# Patient Record
Sex: Female | Born: 1970 | Race: White | Hispanic: No | Marital: Married | Smoking: Never smoker
Health system: Southern US, Community
[De-identification: ages and names within clinical notes are randomized; demographics above are authoritative.]

## PROBLEM LIST (undated history)

## (undated) DIAGNOSIS — F32A Depression, unspecified: Secondary | ICD-10-CM

## (undated) DIAGNOSIS — J45909 Unspecified asthma, uncomplicated: Secondary | ICD-10-CM

## (undated) HISTORY — PX: KNEE SURGERY: SHX244

---

## 1998-03-17 ENCOUNTER — Other Ambulatory Visit: Admission: RE | Admit: 1998-03-17 | Discharge: 1998-03-17 | Payer: Self-pay | Admitting: Obstetrics and Gynecology

## 1999-04-15 ENCOUNTER — Other Ambulatory Visit: Admission: RE | Admit: 1999-04-15 | Discharge: 1999-04-15 | Payer: Self-pay | Admitting: Obstetrics & Gynecology

## 1999-05-31 ENCOUNTER — Encounter: Admission: RE | Admit: 1999-05-31 | Discharge: 1999-05-31 | Payer: Self-pay | Admitting: Family Medicine

## 1999-05-31 ENCOUNTER — Encounter: Payer: Self-pay | Admitting: Family Medicine

## 1999-10-04 ENCOUNTER — Encounter: Payer: Self-pay | Admitting: Otolaryngology

## 1999-10-04 ENCOUNTER — Encounter: Admission: RE | Admit: 1999-10-04 | Discharge: 1999-10-04 | Payer: Self-pay | Admitting: Otolaryngology

## 2000-04-27 ENCOUNTER — Other Ambulatory Visit: Admission: RE | Admit: 2000-04-27 | Discharge: 2000-04-27 | Payer: Self-pay | Admitting: Obstetrics & Gynecology

## 2000-05-23 ENCOUNTER — Other Ambulatory Visit: Admission: RE | Admit: 2000-05-23 | Discharge: 2000-05-23 | Payer: Self-pay | Admitting: Obstetrics & Gynecology

## 2000-12-21 ENCOUNTER — Inpatient Hospital Stay (HOSPITAL_COMMUNITY): Admission: AD | Admit: 2000-12-21 | Discharge: 2000-12-22 | Payer: Self-pay | Admitting: Obstetrics & Gynecology

## 2000-12-21 ENCOUNTER — Encounter (INDEPENDENT_AMBULATORY_CARE_PROVIDER_SITE_OTHER): Payer: Self-pay

## 2001-01-25 ENCOUNTER — Other Ambulatory Visit: Admission: RE | Admit: 2001-01-25 | Discharge: 2001-01-25 | Payer: Self-pay | Admitting: Obstetrics & Gynecology

## 2001-07-17 ENCOUNTER — Encounter: Payer: Self-pay | Admitting: Obstetrics & Gynecology

## 2001-07-17 ENCOUNTER — Ambulatory Visit (HOSPITAL_COMMUNITY): Admission: RE | Admit: 2001-07-17 | Discharge: 2001-07-17 | Payer: Self-pay | Admitting: Obstetrics & Gynecology

## 2002-01-11 ENCOUNTER — Ambulatory Visit (HOSPITAL_COMMUNITY): Admission: RE | Admit: 2002-01-11 | Discharge: 2002-01-11 | Payer: Self-pay | Admitting: Obstetrics & Gynecology

## 2002-01-11 ENCOUNTER — Encounter: Payer: Self-pay | Admitting: Obstetrics & Gynecology

## 2002-01-19 ENCOUNTER — Inpatient Hospital Stay (HOSPITAL_COMMUNITY): Admission: AD | Admit: 2002-01-19 | Discharge: 2002-01-19 | Payer: Self-pay | Admitting: Obstetrics

## 2002-03-12 ENCOUNTER — Encounter: Payer: Self-pay | Admitting: Obstetrics & Gynecology

## 2002-03-12 ENCOUNTER — Ambulatory Visit (HOSPITAL_COMMUNITY): Admission: RE | Admit: 2002-03-12 | Discharge: 2002-03-12 | Payer: Self-pay | Admitting: Obstetrics & Gynecology

## 2002-04-26 ENCOUNTER — Inpatient Hospital Stay (HOSPITAL_COMMUNITY): Admission: AD | Admit: 2002-04-26 | Discharge: 2002-04-26 | Payer: Self-pay | Admitting: Obstetrics & Gynecology

## 2002-05-31 ENCOUNTER — Inpatient Hospital Stay (HOSPITAL_COMMUNITY): Admission: AD | Admit: 2002-05-31 | Discharge: 2002-06-02 | Payer: Self-pay | Admitting: Obstetrics & Gynecology

## 2004-09-20 ENCOUNTER — Ambulatory Visit (HOSPITAL_COMMUNITY): Admission: RE | Admit: 2004-09-20 | Discharge: 2004-09-20 | Payer: Self-pay | Admitting: Obstetrics & Gynecology

## 2006-07-05 ENCOUNTER — Emergency Department: Payer: Self-pay | Admitting: Emergency Medicine

## 2009-01-16 ENCOUNTER — Other Ambulatory Visit: Admission: RE | Admit: 2009-01-16 | Discharge: 2009-01-16 | Payer: Self-pay | Admitting: Family Medicine

## 2009-07-14 ENCOUNTER — Encounter: Payer: Self-pay | Admitting: Gastroenterology

## 2009-07-21 ENCOUNTER — Encounter: Payer: Self-pay | Admitting: Gastroenterology

## 2009-07-27 ENCOUNTER — Ambulatory Visit: Payer: Self-pay | Admitting: Gastroenterology

## 2009-07-27 DIAGNOSIS — R197 Diarrhea, unspecified: Secondary | ICD-10-CM | POA: Insufficient documentation

## 2009-08-12 ENCOUNTER — Telehealth: Payer: Self-pay | Admitting: Gastroenterology

## 2009-08-13 ENCOUNTER — Telehealth: Payer: Self-pay | Admitting: Gastroenterology

## 2010-06-03 NOTE — Letter (Signed)
Summary: Jasmine Moody   Imported By: Sherian Rein 07/30/2009 14:53:27  _____________________________________________________________________  External Attachment:    Type:   Image     Comment:   External Document

## 2010-06-03 NOTE — Assessment & Plan Note (Signed)
Summary: PERSISTANT DIARRHEA...AS.   History of Present Illness Visit Type: consult  Primary GI MD: Elie Goody MD [facility] Primary Provider: Mila Palmer, MD  Requesting Provider: Hall Busing, MD  Chief Complaint: Change in bowel habits, diarrhea, and weight gain  History of Present Illness:   This is a 40 year old female, who has had intermittent diarrhea for 2 months. She notes the onset after a diarrheal illness. That lasted for several days and since that time she has had alternating normal stools and loose stools. Standard blood work, plus a TSH and a celiac panel were negative. She reports that stool cultures and stool Hemoccults were obtained that were also negative. Over the past 4-5 days, her diarrhea has abated in her bowel habits have returned to normal.   GI Review of Systems    Reports weight gain.      Denies abdominal pain, acid reflux, belching, bloating, chest pain, dysphagia with liquids, dysphagia with solids, heartburn, loss of appetite, nausea, vomiting, vomiting blood, and  weight loss.      Reports change in bowel habits and  diarrhea.     Denies anal fissure, black tarry stools, constipation, diverticulosis, fecal incontinence, heme positive stool, hemorrhoids, irritable bowel syndrome, jaundice, light color stool, liver problems, rectal bleeding, and  rectal pain.   Current Medications (verified): 1)  Mirena 20 Mcg/24hr Iud (Levonorgestrel) .... As Directed 2)  Fluoxetine Hcl 20 Mg Tabs (Fluoxetine Hcl) .... One Tablet By Mouth Once Daily  Allergies (verified): 1)  ! 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  Alcohol Use - no Daily Caffeine Use: one daily   Illicit Drug Use - no Patient gets regular exercise.  Review of Systems       The patient complains of night sweats.         The pertinent positives and negatives are noted as above and in the HPI. All other ROS were reviewed and were negative.   Vital Signs:  Patient profile:   40 year old female Height:      64 inches Weight:      134 pounds BMI:     23.08 BSA:     1.65 Pulse rate:   76 / minute Pulse rhythm:   regular BP sitting:   100 / 60  (left arm) Cuff size:   regular  Vitals Entered By: Ok Anis CMA (July 27, 2009 2:30 PM)  Physical Exam  General:  Well developed, well nourished, no acute distress. Head:  Normocephalic and atraumatic. Eyes:  PERRLA, no icterus. Ears:  Normal auditory acuity. Mouth:  No deformity or lesions, dentition normal. Neck:  Supple; no masses or thyromegaly. Lungs:  Clear throughout to auscultation. Heart:  Regular rate and rhythm; no murmurs, rubs,  or bruits. Abdomen:  Soft, nontender and nondistended. No masses, hepatosplenomegaly or hernias noted. Normal bowel sounds. Msk:  Symmetrical with no gross deformities. Normal posture. Pulses:  Normal pulses noted. Extremities:  No clubbing, cyanosis, edema or deformities noted. Neurologic:  Alert and  oriented x4;  grossly normal neurologically. Cervical Nodes:  No significant cervical adenopathy. Inguinal Nodes:  No significant inguinal adenopathy. Psych:  Alert and cooperative. Normal mood and affect.  Impression & Recommendations:  Problem # 1:  DIARRHEA (ICD-787.91) Diarrhea,  which has now abated. I suspect she had a postinfectious diarrhea. If her diarrhea returns, consider colonoscopy to further evaluate for inflammatory bowel disease, and other types of colitis. She is agreeable with this plan and will call if she has further symptoms.  Patient Instructions: 1)  Please continue current medications.  2)  Please schedule a follow-up appointment as needed.  3)  Copy sent to :  Carolin Coy, MD 4)  The medication list was reviewed and reconciled.  All changed / newly prescribed medications were explained.  A complete medication list was provided to the patient / caregiver.

## 2010-06-03 NOTE — Progress Notes (Signed)
Summary: Triage  Phone Note Call from Patient Call back at Home Phone [phone]   Caller: Husband   John Call For: Dr. Russella Dar Reason for Call: Talk to Nurse Summary of Call: Pt continues to be nauseated with vomiting and diarrhea. Initial call taken by: Karna Christmas,  August 12, 2009 4:11 PM  Follow-up for Phone Call        Left message for patient to call back Darcey Nora RN, [facility]  August 13, 2009 9:17 AM  see phone note from 08/14/09 Darcey Nora RN, [facility]  August 14, 2009 9:26 AM

## 2010-06-03 NOTE — Progress Notes (Signed)
Summary: diarrhea  Phone Note Call from Patient Call back at Home Phone [phone] Call back at [phone]   Caller: Patient Call For: Dr. Russella Dar Reason for Call: Talk to Nurse Summary of Call: pt had stomach virus last week and diarrhea "started up again this week"... pt wants to know if she should have a COL Initial call taken by: Vallarie Mare,  August 13, 2009 4:28 PM  Follow-up for Phone Call        Left message for patient to call back Darcey Nora RN, [facility]  August 14, 2009 8:26 AM  Patient with rectal mucus discharge after meals.  She is having incontinence of stool and mucus.  She reports that she had vomiting last week with a stomach virus.  per last office note you were going to consider colon if symptoms returned.  Please advise if ok to proceed with direct colon.    Follow-up by: Darcey Nora RN, CGRN,  August 14, 2009 2:51 PM  Additional Follow-up for Phone Call Additional follow up Details #1::        OK for direct colon. Additional Follow-up by: Meryl Dare MD Clementeen Graham,  August 17, 2009 10:50 AM    Additional Follow-up for Phone Call Additional follow up Details #2::    message left for pt. to call back   Teryl Lucy RN  August 18, 2009 9:26 AM  Left message for patient to call back Darcey Nora RN, [facility]  August 19, 2009 9:17 AM  Patient  scheduled for a direct colon 09/11/09 2:00, pre-visit  09/01/09 4:30 Follow-up by: Darcey Nora RN, CGRN,  August 19, 2009 10:02 AM

## 2010-07-19 ENCOUNTER — Ambulatory Visit
Admission: RE | Admit: 2010-07-19 | Discharge: 2010-07-19 | Disposition: A | Discharge status: Home / Self Care | Payer: BC Managed Care – PPO | Source: Ambulatory Visit | Attending: Family Medicine | Admitting: Family Medicine

## 2010-07-19 ENCOUNTER — Other Ambulatory Visit: Payer: Self-pay | Admitting: Family Medicine

## 2010-07-19 DIAGNOSIS — R52 Pain, unspecified: Secondary | ICD-10-CM

## 2012-10-10 ENCOUNTER — Ambulatory Visit (INDEPENDENT_AMBULATORY_CARE_PROVIDER_SITE_OTHER): Payer: BC Managed Care – PPO | Admitting: Obstetrics & Gynecology

## 2012-10-10 ENCOUNTER — Encounter: Payer: Self-pay | Admitting: Obstetrics & Gynecology

## 2012-10-10 VITALS — BP 121/77 | HR 59 | Temp 97.6°F | Ht 64.0 in | Wt 135.0 lb

## 2012-10-10 DIAGNOSIS — Z309 Encounter for contraceptive management, unspecified: Secondary | ICD-10-CM

## 2012-10-10 DIAGNOSIS — Z30432 Encounter for removal of intrauterine contraceptive device: Secondary | ICD-10-CM

## 2012-10-10 DIAGNOSIS — IMO0001 Reserved for inherently not codable concepts without codable children: Secondary | ICD-10-CM

## 2012-10-10 DIAGNOSIS — Z30430 Encounter for insertion of intrauterine contraceptive device: Secondary | ICD-10-CM

## 2012-10-10 DIAGNOSIS — Z3202 Encounter for pregnancy test, result negative: Secondary | ICD-10-CM

## 2012-10-10 DIAGNOSIS — Z30431 Encounter for routine checking of intrauterine contraceptive device: Secondary | ICD-10-CM

## 2012-10-10 MED ORDER — LEVONORGESTREL 20 MCG/24HR IU IUD
INTRAUTERINE_SYSTEM | Freq: Once | INTRAUTERINE | Status: AC
  Administered 2012-10-10: 1 via INTRAUTERINE

## 2012-10-10 MED ORDER — LEVONORGESTREL 20 MCG/24HR IU IUD: 1.0000 | INTRAUTERINE_SYSTEM | Freq: Once | INTRAUTERINE | Status: DC

## 2012-10-10 NOTE — Patient Instructions (Signed)

## 2012-10-10 NOTE — Progress Notes (Signed)
.  IUD Insertion Procedure Note  Pre-operative Diagnosis: Needs IUD removal /insertion  Post-operative Diagnosis: normal  Indications: contraception  Procedure Details  Urine pregnancy test was done  and result was negative.  The risks (including infection, bleeding, pain, and uterine perforation) and benefits of the procedure were explained to the patient and Written informed consent was obtained.    Cervix cleansed with Betadine. The IUD was removed intact.  Uterus sounded to 8 cm. IUD inserted without difficulty. String visible and trimmed. Patient tolerated procedure well.    Condition: Stable  Complications: None  Plan:  The patient was advised to call for any fever or for prolonged or severe pain or bleeding. She was advised to use OTC analgesics as needed for mild to moderate pain.

## 2012-10-11 ENCOUNTER — Encounter: Payer: Self-pay | Admitting: Obstetrics & Gynecology

## 2012-10-11 ENCOUNTER — Ambulatory Visit: Payer: Self-pay | Admitting: Obstetrics & Gynecology

## 2012-10-11 DIAGNOSIS — Z30431 Encounter for routine checking of intrauterine contraceptive device: Secondary | ICD-10-CM | POA: Insufficient documentation

## 2012-12-06 ENCOUNTER — Other Ambulatory Visit: Payer: Self-pay | Admitting: Family Medicine

## 2012-12-06 ENCOUNTER — Other Ambulatory Visit (HOSPITAL_COMMUNITY)
Admission: RE | Admit: 2012-12-06 | Discharge: 2012-12-06 | Disposition: A | Discharge status: Home / Self Care | Payer: BC Managed Care – PPO | Source: Ambulatory Visit | Attending: Family Medicine | Admitting: Family Medicine

## 2012-12-06 DIAGNOSIS — Z124 Encounter for screening for malignant neoplasm of cervix: Secondary | ICD-10-CM | POA: Insufficient documentation

## 2012-12-06 DIAGNOSIS — Z1151 Encounter for screening for human papillomavirus (HPV): Secondary | ICD-10-CM | POA: Insufficient documentation

## 2016-05-02 HISTORY — PX: BREAST BIOPSY: SHX20

## 2016-10-20 ENCOUNTER — Other Ambulatory Visit: Payer: Self-pay | Admitting: Obstetrics and Gynecology

## 2016-10-20 DIAGNOSIS — Z1231 Encounter for screening mammogram for malignant neoplasm of breast: Secondary | ICD-10-CM

## 2016-11-15 ENCOUNTER — Other Ambulatory Visit: Payer: Self-pay | Admitting: Obstetrics and Gynecology

## 2016-11-15 ENCOUNTER — Ambulatory Visit
Admission: RE | Admit: 2016-11-15 | Discharge: 2016-11-15 | Disposition: A | Discharge status: Home / Self Care | Payer: BC Managed Care – PPO | Source: Ambulatory Visit | Attending: Obstetrics and Gynecology | Admitting: Obstetrics and Gynecology

## 2016-11-15 DIAGNOSIS — Z1231 Encounter for screening mammogram for malignant neoplasm of breast: Secondary | ICD-10-CM | POA: Diagnosis present

## 2016-11-15 DIAGNOSIS — R928 Other abnormal and inconclusive findings on diagnostic imaging of breast: Secondary | ICD-10-CM | POA: Insufficient documentation

## 2016-11-18 ENCOUNTER — Other Ambulatory Visit: Payer: Self-pay | Admitting: Obstetrics and Gynecology

## 2016-11-18 DIAGNOSIS — N6320 Unspecified lump in the left breast, unspecified quadrant: Secondary | ICD-10-CM

## 2016-11-18 DIAGNOSIS — R921 Mammographic calcification found on diagnostic imaging of breast: Secondary | ICD-10-CM

## 2016-11-18 DIAGNOSIS — R928 Other abnormal and inconclusive findings on diagnostic imaging of breast: Secondary | ICD-10-CM

## 2016-11-24 ENCOUNTER — Other Ambulatory Visit: Payer: BC Managed Care – PPO

## 2016-11-24 ENCOUNTER — Inpatient Hospital Stay: Admission: RE | Admit: 2016-11-24 | Payer: BC Managed Care – PPO | Source: Ambulatory Visit

## 2016-11-24 ENCOUNTER — Ambulatory Visit: Payer: BC Managed Care – PPO | Attending: Obstetrics and Gynecology

## 2016-12-16 ENCOUNTER — Ambulatory Visit
Admission: RE | Admit: 2016-12-16 | Discharge: 2016-12-16 | Disposition: A | Discharge status: Home / Self Care | Payer: BC Managed Care – PPO | Source: Ambulatory Visit | Attending: Obstetrics and Gynecology | Admitting: Obstetrics and Gynecology

## 2016-12-16 DIAGNOSIS — R921 Mammographic calcification found on diagnostic imaging of breast: Secondary | ICD-10-CM | POA: Insufficient documentation

## 2016-12-16 DIAGNOSIS — R928 Other abnormal and inconclusive findings on diagnostic imaging of breast: Secondary | ICD-10-CM | POA: Diagnosis present

## 2016-12-16 DIAGNOSIS — N6320 Unspecified lump in the left breast, unspecified quadrant: Secondary | ICD-10-CM | POA: Diagnosis present

## 2016-12-16 DIAGNOSIS — N6342 Unspecified lump in left breast, subareolar: Secondary | ICD-10-CM | POA: Diagnosis not present

## 2016-12-19 ENCOUNTER — Other Ambulatory Visit: Payer: Self-pay | Admitting: Obstetrics and Gynecology

## 2016-12-19 DIAGNOSIS — R928 Other abnormal and inconclusive findings on diagnostic imaging of breast: Secondary | ICD-10-CM

## 2016-12-19 DIAGNOSIS — N6320 Unspecified lump in the left breast, unspecified quadrant: Secondary | ICD-10-CM

## 2016-12-29 ENCOUNTER — Ambulatory Visit
Admission: RE | Admit: 2016-12-29 | Discharge: 2016-12-29 | Disposition: A | Discharge status: Home / Self Care | Payer: BC Managed Care – PPO | Source: Ambulatory Visit | Attending: Obstetrics and Gynecology | Admitting: Obstetrics and Gynecology

## 2016-12-29 DIAGNOSIS — D242 Benign neoplasm of left breast: Secondary | ICD-10-CM | POA: Insufficient documentation

## 2016-12-29 DIAGNOSIS — N6320 Unspecified lump in the left breast, unspecified quadrant: Secondary | ICD-10-CM

## 2016-12-29 DIAGNOSIS — R928 Other abnormal and inconclusive findings on diagnostic imaging of breast: Secondary | ICD-10-CM

## 2016-12-29 DIAGNOSIS — N6324 Unspecified lump in the left breast, lower inner quadrant: Secondary | ICD-10-CM | POA: Diagnosis not present

## 2016-12-31 LAB — SURGICAL PATHOLOGY

## 2017-08-15 ENCOUNTER — Other Ambulatory Visit: Payer: Self-pay | Admitting: Obstetrics and Gynecology

## 2017-08-15 DIAGNOSIS — R921 Mammographic calcification found on diagnostic imaging of breast: Secondary | ICD-10-CM

## 2017-08-30 ENCOUNTER — Ambulatory Visit
Admission: RE | Admit: 2017-08-30 | Discharge: 2017-08-30 | Disposition: A | Discharge status: Home / Self Care | Payer: BC Managed Care – PPO | Source: Ambulatory Visit | Attending: Obstetrics and Gynecology | Admitting: Obstetrics and Gynecology

## 2017-08-30 DIAGNOSIS — R921 Mammographic calcification found on diagnostic imaging of breast: Secondary | ICD-10-CM | POA: Diagnosis not present

## 2019-01-04 ENCOUNTER — Other Ambulatory Visit: Payer: Self-pay

## 2019-01-04 DIAGNOSIS — Z20822 Contact with and (suspected) exposure to covid-19: Secondary | ICD-10-CM

## 2019-01-05 LAB — NOVEL CORONAVIRUS, NAA: SARS-CoV-2, NAA: NOT DETECTED

## 2019-02-06 ENCOUNTER — Other Ambulatory Visit: Payer: Self-pay

## 2019-02-06 DIAGNOSIS — Z20822 Contact with and (suspected) exposure to covid-19: Secondary | ICD-10-CM

## 2019-02-07 LAB — NOVEL CORONAVIRUS, NAA: SARS-CoV-2, NAA: NOT DETECTED

## 2019-02-13 ENCOUNTER — Ambulatory Visit
Admission: RE | Admit: 2019-02-13 | Discharge: 2019-02-13 | Disposition: A | Discharge status: Home / Self Care | Payer: BC Managed Care – PPO | Source: Ambulatory Visit | Attending: Family Medicine | Admitting: Family Medicine

## 2019-02-13 ENCOUNTER — Other Ambulatory Visit: Payer: Self-pay | Admitting: Family Medicine

## 2019-02-13 ENCOUNTER — Other Ambulatory Visit: Payer: Self-pay

## 2019-02-13 DIAGNOSIS — R0602 Shortness of breath: Secondary | ICD-10-CM

## 2019-03-18 IMAGING — US US BREAST*L* LIMITED INC AXILLA
1 series · 12 of 12 positions shown · non-contrast
Comparison: Screening exam, 11/15/2016.

CLINICAL DATA: Screening recall for possible masses in the left
breast and right breast calcifications.

EXAM:
2D DIGITAL DIAGNOSTIC BILATERAL MAMMOGRAM WITH CAD AND ADJUNCT TOMO
ULTRASOUND LEFT BREAST

[Series 1: us breast*left* limited inc axilla · 0.05mm/px · 12 of 12 slices shown]
[im 1/12]
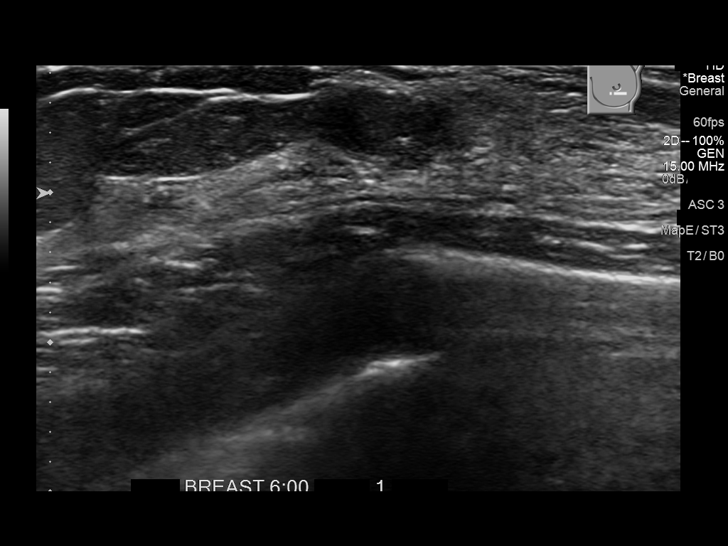
[im 2/12]
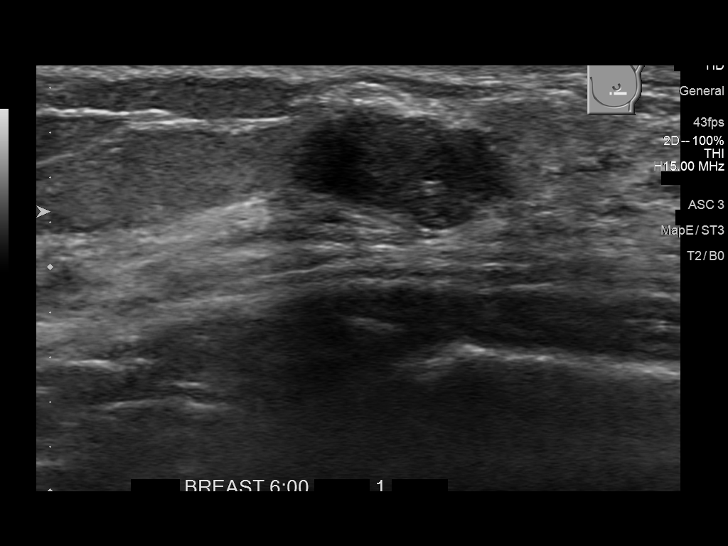
[im 3/12]
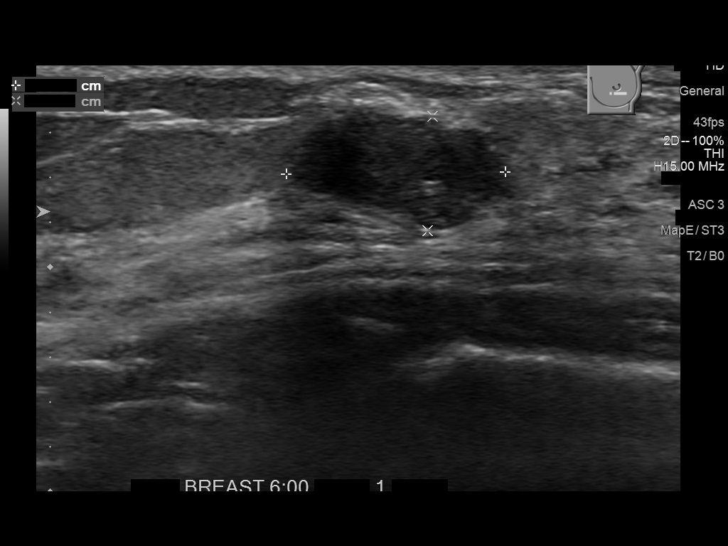
[im 4/12]
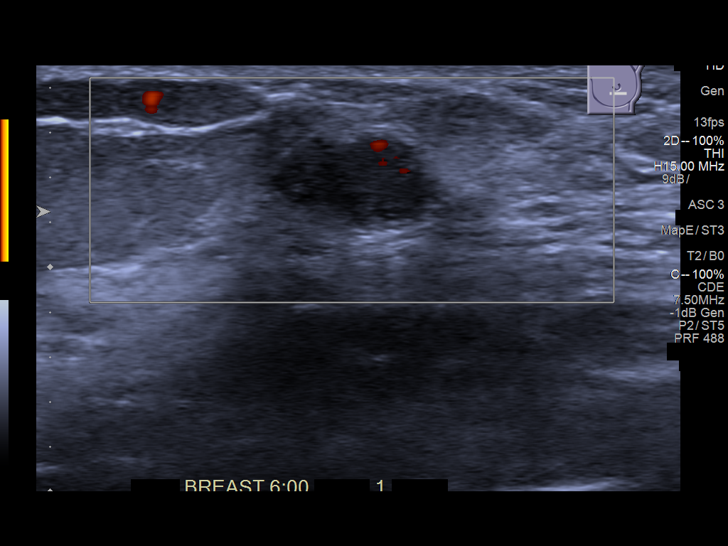
[im 5/12]
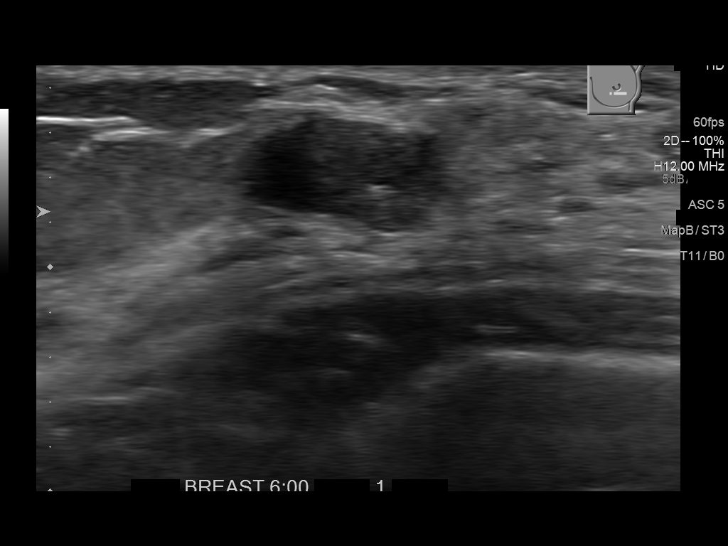
[im 6/12]
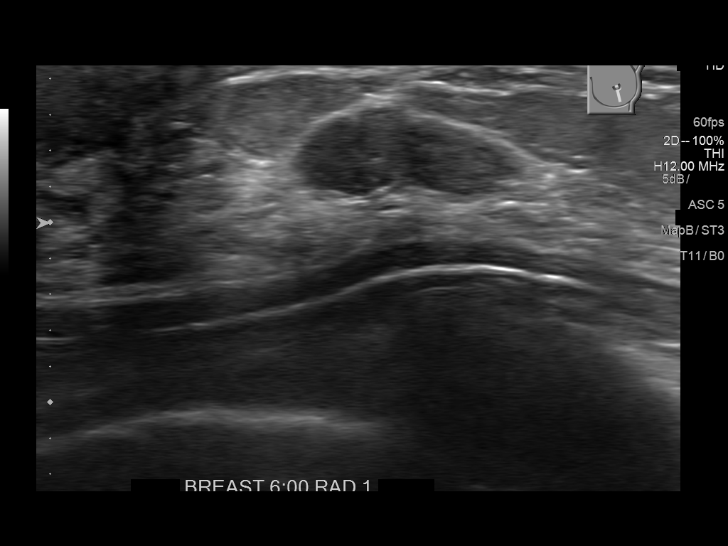
[im 7/12]
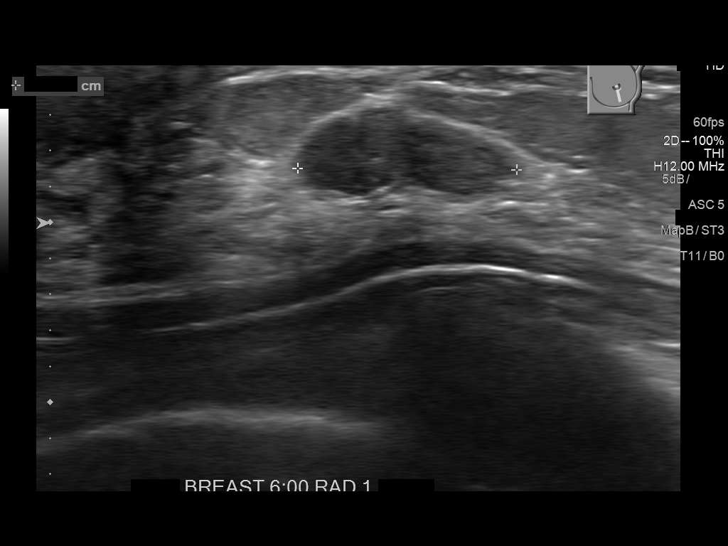
[im 8/12]
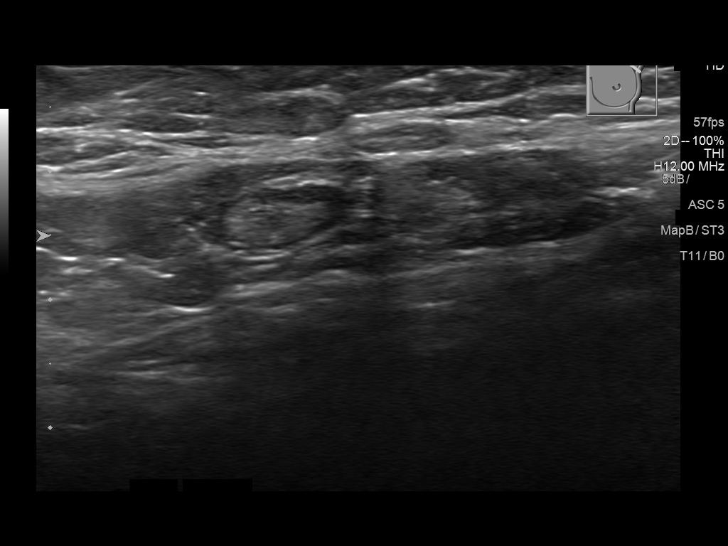
[im 9/12]
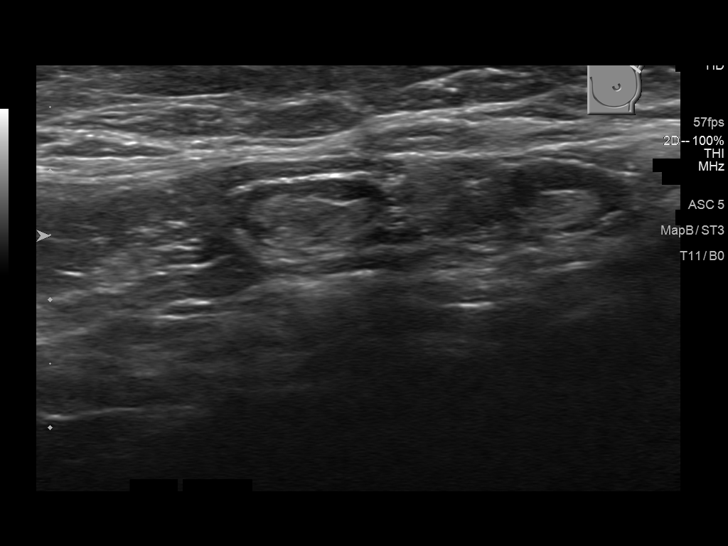
[im 10/12]
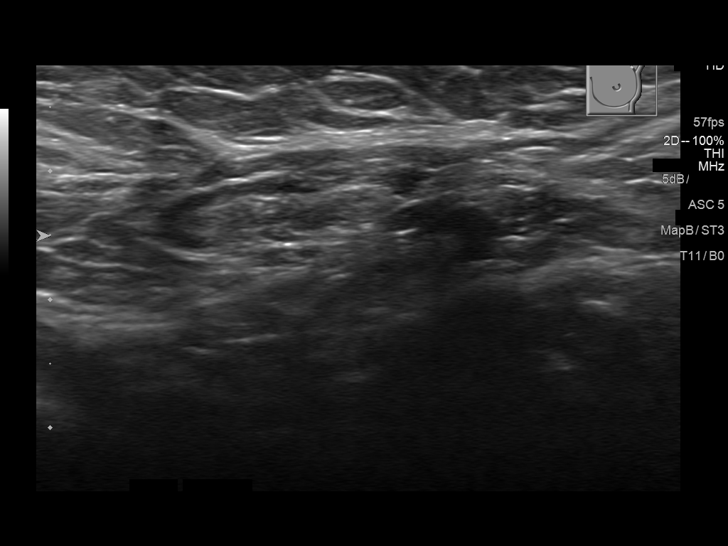
[im 11/12]
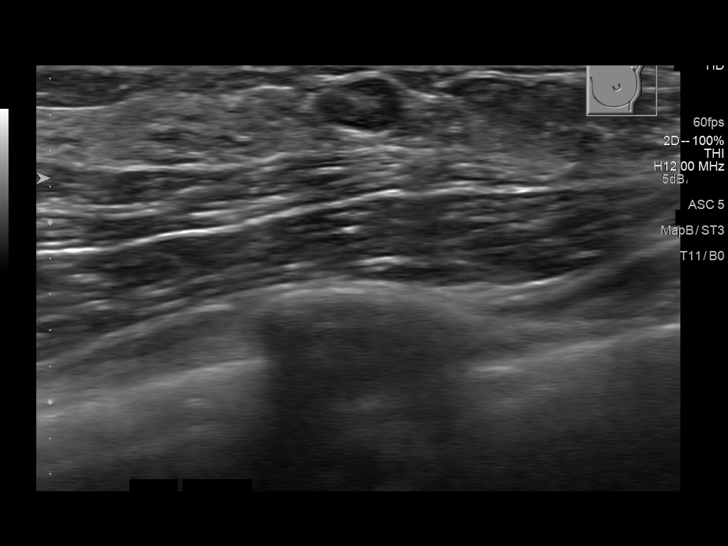
[im 12/12]
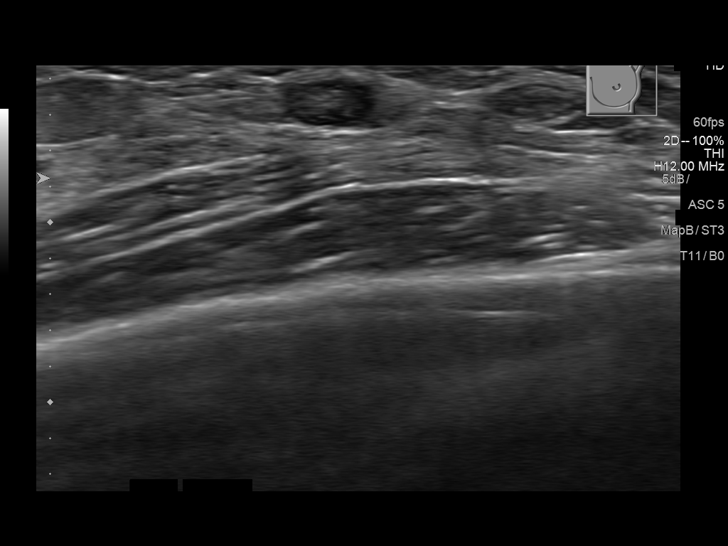

[12 of 12 positions shown; findings below may reference images not displayed]

No older studies.

ACR Breast Density Category c: The breast tissue is heterogeneously
dense, which may obscure small masses.
FINDINGS: On the right, there are scattered small calcifications, with the
largest area projecting towards the 6 o'clock position. These
calcifications, as well as several of the other scattered elsewhere,
show layering on the mL view consistent with milk of calcium. The
other calcifications are punctate and likely benign. There are no
right breast masses or areas of architectural distortion.

On the left, the mass towards the axillary tail region persists. It
is oval with circumscribed margins. Inferior to the nipple, anterior
depth, is a larger mass, with partly defined, circumscribed lobular
margins. There are no areas of architectural distortion. There are
no suspicious left breast calcifications.

Mammographic images were processed with CAD.

On physical exam, no discrete mass is palpated in the infra-areolar
left breast.

Targeted ultrasound is performed, showing a hypoechoic oval mass
with mostly circumscribed margins, but a somewhat lobulated contour.
There is an echogenic focus within it suggesting a calcification.
Mass does show blood flow on color Doppler analysis. It lies in the
6 o'clock position, 1 cm from the nipple. It measures 12 x 5 x 10
mm. It is oriented parallel to skin. This corresponds to the
anterior infra-areolar mass seen mammographically. In the left
axilla there are multiple normal lymph nodes, the more inferior of
which reflects the circumscribed mass seen mammographically.
IMPRESSION: 1. Hypoechoic infra-areolar mass in the anterior left breast at 6
o'clock measuring 12 mm. Given the lobulated contour, biopsy is
recommended.
2. Normal left axillary lymph nodes.
3. Right breast calcifications. Most of these are benign milk of
calcium. Some are indeterminate but likely benign. Short-term
follow-up is recommended.

RECOMMENDATION:
1. Ultrasound-guided core needle biopsy of the 6 o'clock position
left breast mass.
2. Diagnostic mammography of the right breast to reassess the
calcifications in 6 months.

I have discussed the findings and recommendations with the patient.
Results were also provided in writing at the conclusion of the
visit. If applicable, a reminder letter will be sent to the patient
regarding the next appointment.

BI-RADS CATEGORY  4: Suspicious.

## 2019-04-15 ENCOUNTER — Other Ambulatory Visit: Admission: RE | Admit: 2019-04-15 | Payer: BC Managed Care – PPO | Source: Ambulatory Visit

## 2019-04-15 ENCOUNTER — Telehealth: Payer: Self-pay

## 2019-04-16 ENCOUNTER — Other Ambulatory Visit
Admission: RE | Admit: 2019-04-16 | Discharge: 2019-04-16 | Disposition: A | Discharge status: Home / Self Care | Payer: BC Managed Care – PPO | Source: Ambulatory Visit | Attending: Family Medicine | Admitting: Family Medicine

## 2019-04-16 ENCOUNTER — Other Ambulatory Visit: Payer: Self-pay

## 2019-04-16 ENCOUNTER — Other Ambulatory Visit (HOSPITAL_COMMUNITY): Payer: BC Managed Care – PPO

## 2019-04-16 DIAGNOSIS — Z01812 Encounter for preprocedural laboratory examination: Secondary | ICD-10-CM | POA: Diagnosis present

## 2019-04-16 DIAGNOSIS — Z20828 Contact with and (suspected) exposure to other viral communicable diseases: Secondary | ICD-10-CM | POA: Diagnosis not present

## 2019-04-16 NOTE — Telephone Encounter (Signed)
Appt placed today pt aware

## 2019-04-17 LAB — SARS CORONAVIRUS 2 (TAT 6-24 HRS): SARS Coronavirus 2: NEGATIVE

## 2019-04-19 ENCOUNTER — Other Ambulatory Visit: Payer: Self-pay | Admitting: Internal Medicine

## 2019-04-19 ENCOUNTER — Encounter: Payer: BC Managed Care – PPO | Admitting: Internal Medicine

## 2019-04-19 ENCOUNTER — Other Ambulatory Visit: Payer: Self-pay

## 2019-04-19 DIAGNOSIS — R0609 Other forms of dyspnea: Secondary | ICD-10-CM

## 2019-04-19 LAB — PULMONARY FUNCTION TEST
DL/VA % pred: 105 %
DL/VA: 4.57 ml/min/mmHg/L
DLCO unc % pred: 120 %
DLCO unc: 25.48 ml/min/mmHg
FEF 25-75 Post: 3.28 L/sec
FEF 25-75 Pre: 2.91 L/sec
FEF2575-%Change-Post: 12 %
FEF2575-%Pred-Post: 115 %
FEF2575-%Pred-Pre: 102 %
FEV1-%Change-Post: 2 %
FEV1-%Pred-Post: 114 %
FEV1-%Pred-Pre: 111 %
FEV1-Post: 3.25 L
FEV1-Pre: 3.17 L
FEV1FVC-%Change-Post: 1 %
FEV1FVC-%Pred-Pre: 97 %
FEV6-%Change-Post: 1 %
FEV6-%Pred-Post: 116 %
FEV6-%Pred-Pre: 115 %
FEV6-Post: 4.08 L
FEV6-Pre: 4.02 L
FEV6FVC-%Change-Post: 0 %
FEV6FVC-%Pred-Post: 102 %
FEV6FVC-%Pred-Pre: 102 %
FVC-%Change-Post: 1 %
FVC-%Pred-Post: 114 %
FVC-%Pred-Pre: 112 %
FVC-Post: 4.08 L
FVC-Pre: 4.02 L
Post FEV1/FVC ratio: 80 %
Post FEV6/FVC ratio: 100 %
Pre FEV1/FVC ratio: 79 %
Pre FEV6/FVC Ratio: 100 %
RV % pred: 155 %
RV: 2.72 L
TLC % pred: 131 %
TLC: 6.64 L

## 2019-04-23 ENCOUNTER — Telehealth: Payer: Self-pay | Admitting: Internal Medicine

## 2019-04-23 NOTE — Telephone Encounter (Signed)
Pt's PFT has been printed and results have been faxed back to her PCP. Nothing further needed.

## 2019-11-28 IMAGING — CR DG CHEST 2V
2 series · 2 of 2 positions shown · non-contrast
Comparison: None.

CLINICAL DATA: Shortness of breath and chest discomfort since
December 2018.

EXAM:
CHEST - 2 VIEW

[w chest pa]
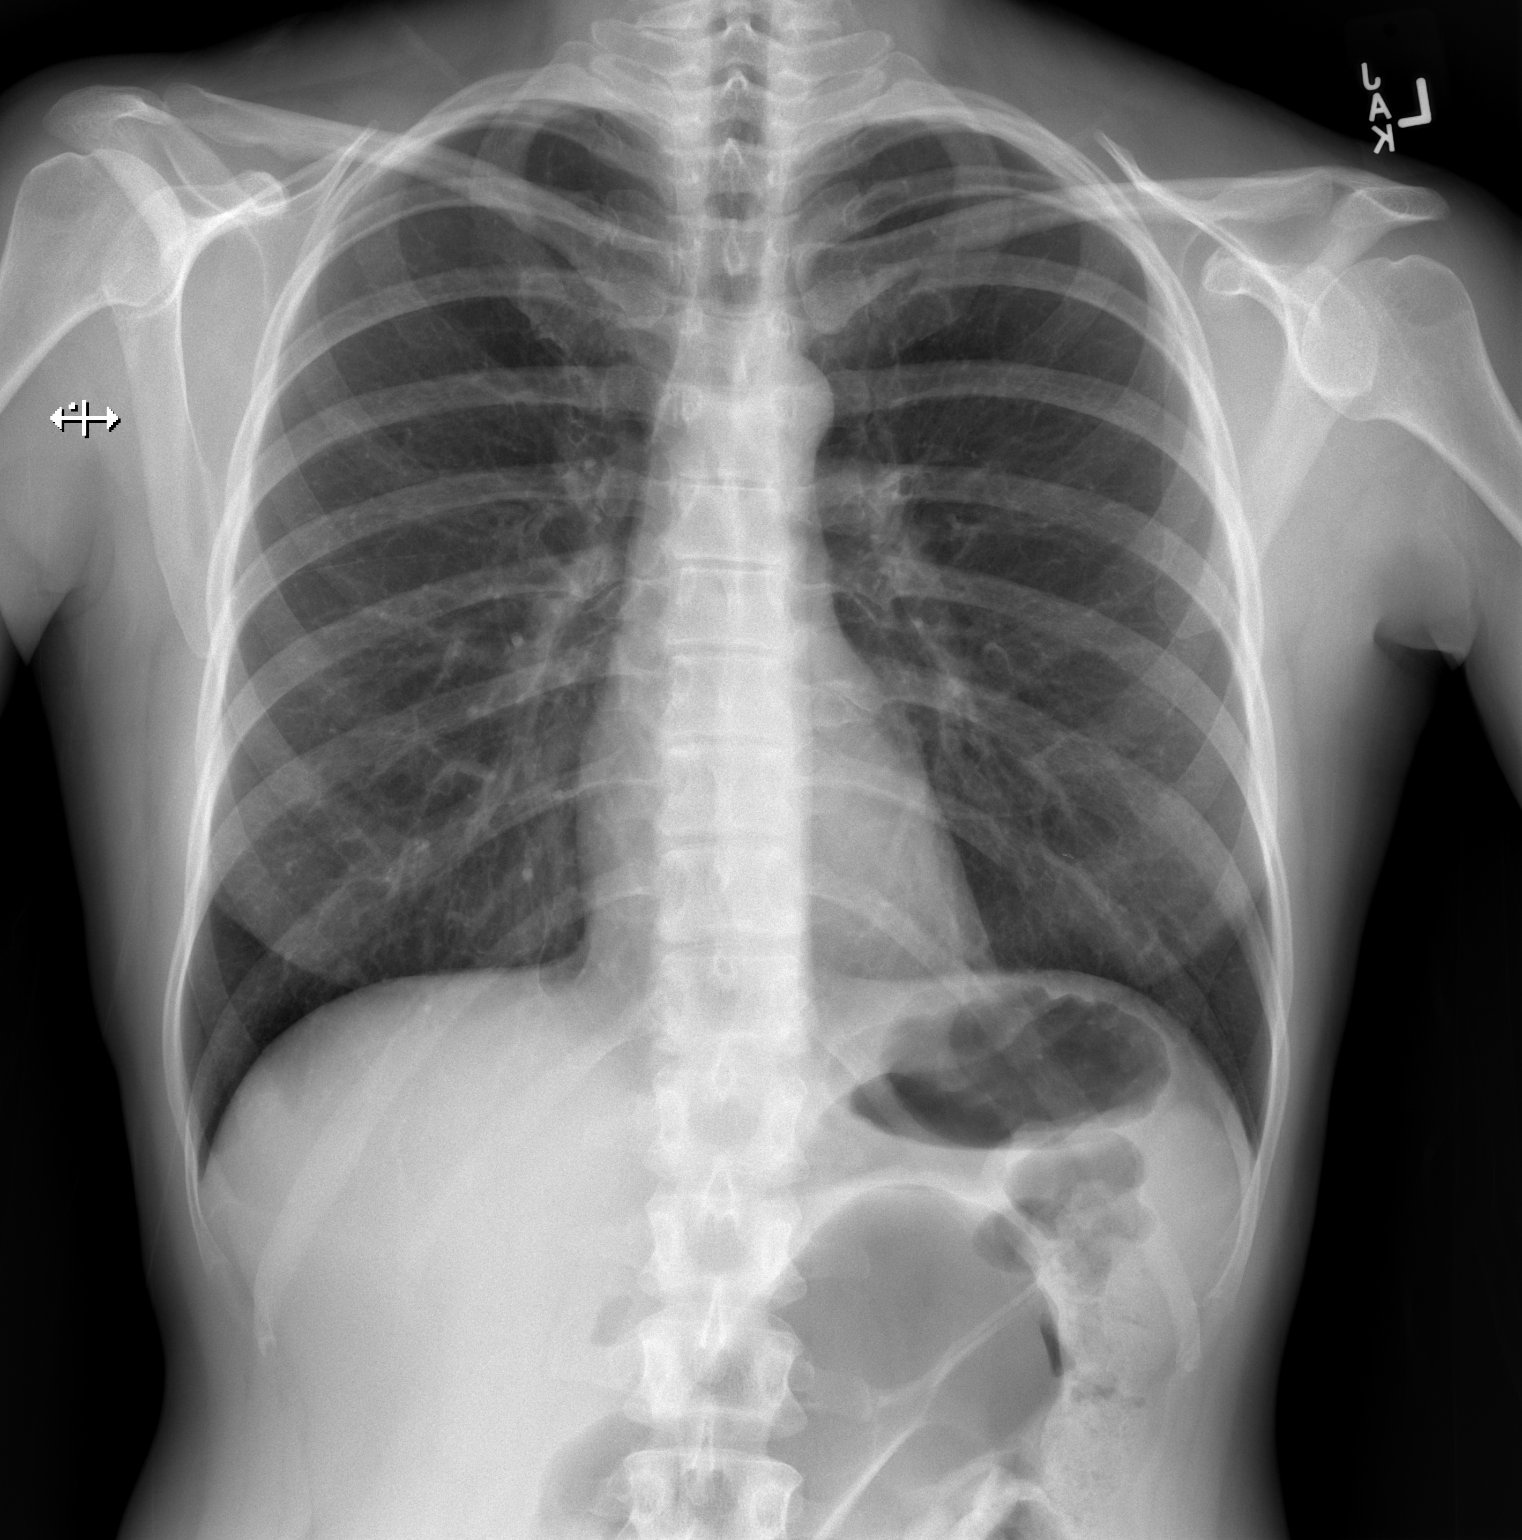

[w chest lat]
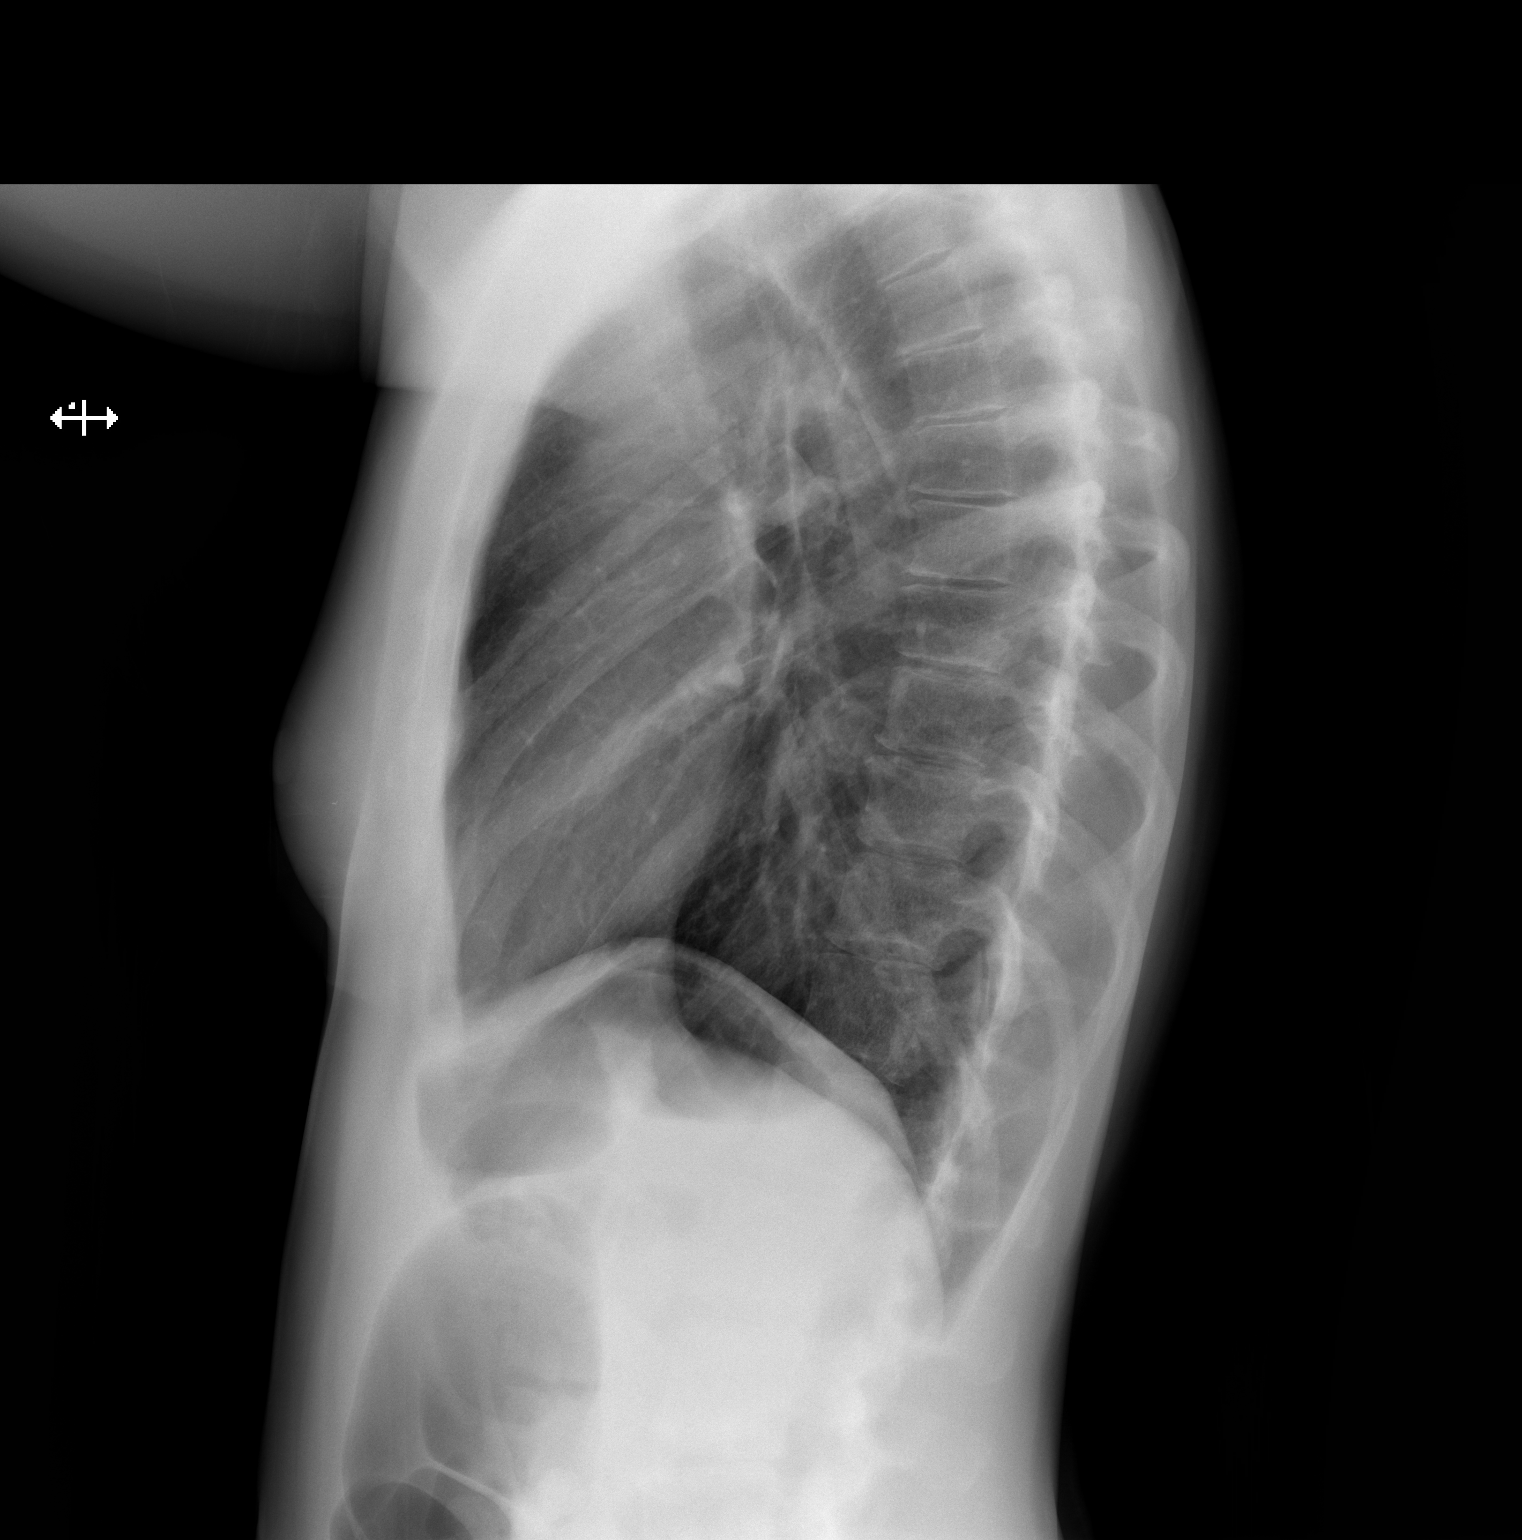

[2 of 2 positions shown; findings below may reference images not displayed]

FINDINGS: Lungs clear. Heart size normal. No pneumothorax or pleural fluid. No
bony abnormality.
IMPRESSION: Normal chest.

## 2020-01-27 ENCOUNTER — Other Ambulatory Visit: Payer: BC Managed Care – PPO

## 2020-01-27 DIAGNOSIS — Z20822 Contact with and (suspected) exposure to covid-19: Secondary | ICD-10-CM

## 2020-01-28 LAB — NOVEL CORONAVIRUS, NAA: SARS-CoV-2, NAA: NOT DETECTED

## 2020-01-28 LAB — SARS-COV-2, NAA 2 DAY TAT

## 2020-04-24 ENCOUNTER — Ambulatory Visit
Admission: EM | Admit: 2020-04-24 | Discharge: 2020-04-24 | Disposition: A | Discharge status: Home / Self Care | Payer: BC Managed Care – PPO

## 2020-04-24 ENCOUNTER — Encounter: Payer: Self-pay | Admitting: Emergency Medicine

## 2020-04-24 ENCOUNTER — Other Ambulatory Visit: Payer: Self-pay

## 2020-04-24 ENCOUNTER — Ambulatory Visit: Payer: Self-pay

## 2020-04-24 DIAGNOSIS — Z8709 Personal history of other diseases of the respiratory system: Secondary | ICD-10-CM

## 2020-04-24 DIAGNOSIS — U071 COVID-19: Secondary | ICD-10-CM | POA: Diagnosis not present

## 2020-04-24 HISTORY — DX: Depression, unspecified: F32.A

## 2020-04-24 HISTORY — DX: Unspecified asthma, uncomplicated: J45.909

## 2020-04-24 MED ORDER — PROMETHAZINE-DM 6.25-15 MG/5ML PO SYRP: 2.5000 mL | ORAL_SOLUTION | Freq: Four times a day (QID) | ORAL | 0 refills | Status: AC | PRN

## 2020-04-24 MED ORDER — PREDNISONE 20 MG PO TABS: 40.0000 mg | ORAL_TABLET | Freq: Every day | ORAL | 0 refills | Status: AC

## 2020-04-24 NOTE — ED Provider Notes (Signed)
Jasmine Moody    CSN: [id number] Arrival date & time: 04/24/20  1159      History   Chief Complaint Chief Complaint  Patient presents with  . Cough    HPI Jasmine Moody is a 49 y.o. female.   Jasmine Moody presents with complaints of persistent cough which is causing soreness even from coughing so much. Diagnosed with covid-19 earlier this week, original symptoms started around 7 days ago. No further fevers. She feels like her other symptoms are improving, but cough is persisting. This morning with chest tightness. She does have asthma. Has not had to use rescue inhalers but does have them if needed. No gi symptoms. No shortness of breath .    ROS per HPI, negative if not otherwise mentioned.      Past Medical History:  Diagnosis Date  . Asthma   . Depressed     Patient Active Problem List   Diagnosis Date Noted  . Family planning, IUD (intrauterine device) check/reinsertion/removal 10/11/2012  . DIARRHEA 07/27/2009    Past Surgical History:  Procedure Laterality Date  . BREAST BIOPSY Left 2018   FIBROADENOMA.   Marland Kitchen KNEE SURGERY      OB History   No obstetric history on file.      Home Medications    Prior to Admission medications   Medication Sig Start Date End Date Taking? Authorizing Provider  Fluticasone-Salmeterol [facility] INHUB) 250-50 MCG/DOSE AEPB  01/27/20  Yes [provider]  FLUoxetine (PROZAC) 20 MG capsule  10/04/12   [provider]  fluticasone Asencion Islam) 50 MCG/ACT nasal spray  10/08/12   [provider]  fluticasone (FLONASE) 50 MCG/ACT nasal spray     [provider]  levonorgestrel (MIRENA) 20 MCG/24HR IUD 1 each by Intrauterine route once.    [provider]  meloxicam (MOBIC) 15 MG tablet  08/28/12   [provider]  predniSONE (DELTASONE) 20 MG tablet Take 2 tablets (40 mg total) by mouth daily with breakfast for 5 days. 04/24/20 04/29/20  Zigmund Gottron, NP   promethazine-dextromethorphan (PROMETHAZINE-DM) 6.25-15 MG/5ML syrup Take 2.5 mLs by mouth 4 (four) times daily as needed for cough. 04/24/20   Zigmund Gottron, NP  Spacer/Aero-Holding Josiah Lobo (AEROCHAMBER MV) inhaler     [provider]    Family History Family History  Problem Relation Age of Onset  . Breast cancer Neg Hx     Social History Social History   Tobacco Use  . Smoking status: Never Smoker  . Smokeless tobacco: Never Used  Substance Use Topics  . Alcohol use: No  . Drug use: No     Allergies   Sulfonamide derivatives   Review of Systems Review of Systems   Physical Exam Triage Vital Signs ED Triage Vitals  Enc Vitals Group     BP 04/24/20 1231 110/73     Pulse Rate 04/24/20 1231 68     Resp 04/24/20 1231 17     Temp 04/24/20 1231 98.2 F (36.8 C)     Temp Source 04/24/20 1231 Oral     SpO2 04/24/20 1231 98 %     Weight 04/24/20 1240 140 lb (63.5 kg)     Height 04/24/20 1240 5\' 4"  (1.626 m)     Head Circumference --      Peak Flow --      Pain Score 04/24/20 1240 0     Pain Loc --      Pain Edu? --  Excl. in GC? --    No data found.  Updated Vital Signs BP 110/73 (BP Location: Left Arm)   Pulse 68   Temp 98.2 F (36.8 C) (Oral)   Resp 17   Ht 5\' 4"  (1.626 m)   Wt 140 lb (63.5 kg)   SpO2 98%   BMI 24.03 kg/m    Physical Exam Constitutional:      General: She is not in acute distress.    Appearance: She is well-developed.  Cardiovascular:     Rate and Rhythm: Normal rate.  Pulmonary:     Effort: Pulmonary effort is normal.     Breath sounds: No wheezing.     Comments: Occasional dry cough noted  Skin:    General: Skin is warm and dry.  Neurological:     Mental Status: She is alert and oriented to person, place, and time.      UC Treatments / Results  Labs (all labs ordered are listed, but only abnormal results are displayed) Labs Reviewed - No data to display  EKG   Radiology No results  found.  Procedures Procedures (including critical care time)  Medications Ordered in UC Medications - No data to display  Initial Impression / Assessment and Plan / UC Course  I have reviewed the triage vital signs and the nursing notes.  Pertinent labs & imaging results that were available during my care of the patient were reviewed by me and considered in my medical decision making (see chart for details).     Non toxic. Benign physical exam.  Vitals stable, no hypoxia. No work of breathing. Known covid-19. Supportive cares recommended. Return precautions provided. Patient verbalized understanding and agreeable to plan.  Ambulatory out of clinic without difficulty.    Final Clinical Impressions(s) / UC Diagnoses   Final diagnoses:  Acute COVID-19  History of asthma     Discharge Instructions     Unfortunately covid-19 can cause ongoing cough, particularly if you have asthma.  Continue with over the counter medications as needed for symptoms.  You may try some vitamins to help your immune system potentially:  Vitamin C 500mg  twice a day. Zinc 50mg  daily. Vitamin D 5000IU daily.   Use of inhaler as needed for wheezing or shortness of breath.   I have sent steroids if you note increased chest tightness or wheezing.  A few days of cough syrup as needed for severe cough.  Follow up with your primary care provider as needed or return if worsening.    ED Prescriptions    Medication Sig Dispense Auth. Provider   promethazine-dextromethorphan (PROMETHAZINE-DM) 6.25-15 MG/5ML syrup Take 2.5 mLs by mouth 4 (four) times daily as needed for cough. 50 mL Augusto Gamble B, NP   predniSONE (DELTASONE) 20 MG tablet Take 2 tablets (40 mg total) by mouth daily with breakfast for 5 days. 10 tablet Zigmund Gottron, NP     PDMP not reviewed this encounter.   Zigmund Gottron, NP 04/24/20 1320

## 2020-04-24 NOTE — ED Triage Notes (Signed)
Patient in office sorethroat, cough and headache. 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 Her husband also is quarantine.  Patient has Asthma with persistent cough   OTC: Sudafed, Dayquil, robitusson

## 2020-04-24 NOTE — Discharge Instructions (Addendum)
Unfortunately covid-19 can cause ongoing cough, particularly if you have asthma.  Continue with over the counter medications as needed for symptoms.  You may try some vitamins to help your immune system potentially:  Vitamin C 500mg  twice a day. Zinc 50mg  daily. Vitamin D 5000IU daily.   Use of inhaler as needed for wheezing or shortness of breath.   I have sent steroids if you note increased chest tightness or wheezing.  A few days of cough syrup as needed for severe cough.  Follow up with your primary care provider as needed or return if worsening.

## 2020-04-28 ENCOUNTER — Ambulatory Visit: Payer: Self-pay

## 2021-12-16 ENCOUNTER — Ambulatory Visit (INDEPENDENT_AMBULATORY_CARE_PROVIDER_SITE_OTHER): Payer: BC Managed Care – PPO

## 2021-12-16 ENCOUNTER — Encounter: Payer: Self-pay | Admitting: Podiatry

## 2021-12-16 ENCOUNTER — Ambulatory Visit: Payer: BC Managed Care – PPO | Admitting: Podiatry

## 2021-12-16 DIAGNOSIS — M79671 Pain in right foot: Secondary | ICD-10-CM

## 2021-12-16 DIAGNOSIS — M79672 Pain in left foot: Secondary | ICD-10-CM

## 2021-12-16 DIAGNOSIS — M779 Enthesopathy, unspecified: Secondary | ICD-10-CM | POA: Diagnosis not present

## 2021-12-16 DIAGNOSIS — M7752 Other enthesopathy of left foot: Secondary | ICD-10-CM

## 2021-12-16 DIAGNOSIS — M7751 Other enthesopathy of right foot: Secondary | ICD-10-CM

## 2021-12-16 MED ORDER — TRIAMCINOLONE ACETONIDE 10 MG/ML IJ SUSP
20.0000 mg | Freq: Once | INTRAMUSCULAR | Status: AC
  Administered 2021-12-16: 20 mg

## 2021-12-17 NOTE — Progress Notes (Signed)
Subjective:   Patient ID: Jasmine Moody, female   DOB: 51 y.o.   MRN: [id number]   HPI Patient is developed a lot of pain around her big toe joint of both feet that is occurred in the relative recent past.  She is very active did buy some new shoes does not remember any other specific issue and it seems to be activated by walking flexing and certain types of shoes.  Patient does not smoke likes to be active   Review of Systems  All other systems reviewed and are negative.       Objective:  Physical Exam Vitals and nursing note reviewed.  Constitutional:      Appearance: She is well-developed.  Pulmonary:     Effort: Pulmonary effort is normal.  Musculoskeletal:        General: Normal range of motion.  Skin:    General: Skin is warm.  Neurological:     Mental Status: She is alert.     Neurovascular status found to be intact muscle strength was found to be adequate range of motion adequate.  Patient does have moderate deformity around the first metatarsal head bilateral with inflammation fluid and what appears to be irritation around the medial aspect of the first metatarsal head bilateral that has been relatively acute in its presentation.  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  I did do sterile prep and injected around the first MPJ and into the capsule 3 mg Dexasone Kenalog 5 mg Xylocaine bilateral and advised on wider shoes soaks and if symptoms persist may have to consider other treatments  X-rays indicate mild deformity around the first metatarsal head bilateral consistent with low-grade bunion deformity no other pathology noted

## 2021-12-30 ENCOUNTER — Other Ambulatory Visit: Payer: Self-pay | Admitting: Podiatry

## 2021-12-30 DIAGNOSIS — M779 Enthesopathy, unspecified: Secondary | ICD-10-CM

## 2022-03-15 ENCOUNTER — Ambulatory Visit: Payer: BC Managed Care – PPO | Admitting: Podiatry

## 2022-03-15 DIAGNOSIS — M7750 Other enthesopathy of unspecified foot: Secondary | ICD-10-CM | POA: Diagnosis not present

## 2022-03-15 MED ORDER — MELOXICAM 15 MG PO TABS: 15.0000 mg | ORAL_TABLET | Freq: Every day | ORAL | 1 refills | Status: DC

## 2022-03-15 MED ORDER — METHYLPREDNISOLONE 4 MG PO TBPK: ORAL_TABLET | ORAL | 0 refills | Status: AC

## 2022-03-15 NOTE — Progress Notes (Signed)
   Chief Complaint  Patient presents with   Foot Pain    Patient is here for bilateral foot pain.    HPI: 51 y.o. female presenting today for follow-up evaluation of bilateral forefoot pain along the dorsum of the bilateral feet that has been ongoing for several months now.  She was last seen here in the office 12/16/2021 and cortisone injections were administered by another provider into the first MTP bilateral.  She says that she did not get any alleviation of her symptoms.  She continues to have pain along the dorsum of the feet right greater than the left.  Denies a history of injury.  It began this past summer.  She presents for further treatment and evaluation  Past Medical History:  Diagnosis Date   Asthma    Depressed     Past Surgical History:  Procedure Laterality Date   BREAST BIOPSY Left 2018   FIBROADENOMA.    KNEE SURGERY      Allergies  Allergen Reactions   Sulfonamide Derivatives      Physical Exam: General: The patient is alert and oriented x3 in no acute distress.  Dermatology: Skin is warm, dry and supple bilateral lower extremities. Negative for open lesions or macerations.  Vascular: Palpable pedal pulses bilaterally. Capillary refill within normal limits.  Negative for any significant edema or erythema  Neurological: Light touch and protective threshold grossly intact  Musculoskeletal Exam: Very mild hallux valgus.  There is no pain with palpation or range of motion of the first MTP.  No pain with medial eminence of the first metatarsal head.  There is pain with plantarflexion of the toes and forced dorsiflexion of the toes against resistance.  The pain is along the extensor tendons specifically bilateral right greater than the left.  Palpation along the extensor tendons does elicit some tenderness and pain.  Radiographic Exam B/L feet 12/16/2021:  Normal osseous mineralization. Joint spaces preserved.  No fractures or cortical irregularities.  Mild hallux  valgus deformity noted.  Assessment: 1.  Suspect extensor tendinitis bilateral feet RT > LT   Plan of Care:  1. Patient evaluated. X-Rays reviewed that were taken 12/16/2021.  2.  Discussed extensor tendinitis findings with the patient and she agrees that it is the extensor tendons. 3.  For now we are going to treat the patient with anti-inflammatory and immobilization. 4.  Prescription for Medrol Dosepak 5.  Prescription for meloxicam 15 mg daily 6.  Postsurgical shoe dispensed right foot.  WBAT to immobilize the MTP joints and toes and allow the extensor tendons to rest during ambulation 7.  Continue topical anti-inflammatory such as Voltaren  8.  Return to clinic in 4 weeks  *Fifth grade teacher at [employer]      Edrick Kins, DPM [facility]  Dr. Edrick Kins, DPM    [address], [place] [address]                Office [phone]  Fax [phone]

## 2022-04-12 ENCOUNTER — Ambulatory Visit: Payer: BC Managed Care – PPO | Admitting: Podiatry

## 2022-04-12 DIAGNOSIS — M7752 Other enthesopathy of left foot: Secondary | ICD-10-CM

## 2022-04-12 DIAGNOSIS — M7751 Other enthesopathy of right foot: Secondary | ICD-10-CM | POA: Diagnosis not present

## 2022-04-12 NOTE — Progress Notes (Signed)
   Chief Complaint  Patient presents with   Foot Pain    Patient is here for bilateral foot pain,she states that she has rheumatoid arthritis in her hand s and has some other questions for the provider.    HPI: 51 y.o. female presenting today for follow-up evaluation of bilateral forefoot pain.  Patient states that there is some improvement with the cortisone injections.  Presenting for further treatment and evaluation  Past Medical History:  Diagnosis Date   Asthma    Depressed     Past Surgical History:  Procedure Laterality Date   BREAST BIOPSY Left 2018   FIBROADENOMA.    KNEE SURGERY      Allergies  Allergen Reactions   Sulfonamide Derivatives      Physical Exam: General: The patient is alert and oriented x3 in no acute distress.  Dermatology: Skin is warm, dry and supple bilateral lower extremities. Negative for open lesions or macerations.  Vascular: Palpable pedal pulses bilaterally. Capillary refill within normal limits.  Negative for any significant edema or erythema  Neurological: Light touch and protective threshold grossly intact  Musculoskeletal Exam: Moderate hallux vAlgus bilateral.  There is some tenderness with palpation to the first metatarsal head medial eminence bilateral  Radiographic Exam 12/16/2021:  Normal osseous mineralization. Joint spaces preserved. No fracture/dislocation/boney destruction.  Mild to moderate hallux valgus deformity noted bilateral with an increased intermetatarsal angle and medial deviation of the first metatarsal head  Assessment: 1.  Symptomatic hallux valgus bilateral   Plan of Care:  1. Patient evaluated.  2.  Today the patient's pain seems to be more closely associated with bunion pain.  There is pain with palpation of the medial prominence of the first metatarsal head. 3.  Prior to any surgical intervention for her bunion she would like to pursue custom orthotics.  I do believe this would help support the medial  longitudinal arch of the foot and possibly offload pressure from the bunion site 4.  Appointment with orthotics department for custom molded insoles 5.  Recommend good supportive shoes and sneakers that are wide in the toebox area do not irritate the bunion 6.  Return to clinic as needed     Edrick Kins, DPM [facility]  Dr. Edrick Kins, DPM    [address], [place] [address]                Office [phone]  Fax [phone]

## 2022-04-13 ENCOUNTER — Other Ambulatory Visit: Payer: BC Managed Care – PPO

## 2022-04-14 ENCOUNTER — Ambulatory Visit: Payer: BC Managed Care – PPO | Admitting: *Deleted

## 2022-04-14 DIAGNOSIS — M7751 Other enthesopathy of right foot: Secondary | ICD-10-CM

## 2022-04-14 NOTE — Progress Notes (Signed)
Patient presents today to be casted for custom molded orthotics.  Impression foam cast was taken.   Patient info-  Shoe size: 7.5 WIDE  Shoe style: ATHLETIC  Insurance: [employer]   Patient will be notified once orthotics arrive in office and reappoint for fitting at that time.

## 2022-05-26 ENCOUNTER — Ambulatory Visit (INDEPENDENT_AMBULATORY_CARE_PROVIDER_SITE_OTHER): Payer: BC Managed Care – PPO

## 2022-05-26 DIAGNOSIS — M7752 Other enthesopathy of left foot: Secondary | ICD-10-CM

## 2022-05-26 DIAGNOSIS — M7751 Other enthesopathy of right foot: Secondary | ICD-10-CM

## 2022-05-26 NOTE — Progress Notes (Signed)
Patient presents today to pick up custom molded foot orthotics, diagnosed with capsulitis (MTP) by Dr. Amalia Hailey.   Orthotics were dispensed and fit was satisfactory. Reviewed instructions for break-in and wear. Written instructions given to patient.  Patient will follow up as needed.   [facility] - order # [id number]

## 2022-05-31 ENCOUNTER — Other Ambulatory Visit: Payer: Self-pay | Admitting: Podiatry

## 2023-07-10 ENCOUNTER — Ambulatory Visit: Payer: 59 | Admitting: Podiatry

## 2023-07-10 ENCOUNTER — Encounter: Payer: Self-pay | Admitting: Podiatry

## 2023-07-10 DIAGNOSIS — M7752 Other enthesopathy of left foot: Secondary | ICD-10-CM

## 2023-07-10 DIAGNOSIS — M7751 Other enthesopathy of right foot: Secondary | ICD-10-CM | POA: Diagnosis not present

## 2023-07-10 NOTE — Progress Notes (Signed)
   Chief Complaint  Patient presents with   Difficulty Walking    Patient states she lost one of her inserts, and would like to get new ones     HPI: 53 y.o. female presenting today for follow-up evaluation of bilateral forefoot pain.  Patient was last seen in the office by me on 04/12/2022.  At that time she received a pair of custom orthotics and she says they help significantly.  Unfortunately she lost one of her orthotics and she is requesting a new pair.  She has began to notice increased pain to the feet since she has not been wearing her orthotics  Past Medical History:  Diagnosis Date   Asthma    Depressed     Past Surgical History:  Procedure Laterality Date   BREAST BIOPSY Left 2018   FIBROADENOMA.    KNEE SURGERY      Allergies  Allergen Reactions   Sulfonamide Derivatives      Physical Exam: General: The patient is alert and oriented x3 in no acute distress.  Dermatology: Skin is warm, dry and supple bilateral lower extremities. Negative for open lesions or macerations.  Vascular: Palpable pedal pulses bilaterally. Capillary refill within normal limits.  Negative for any significant edema or erythema  Neurological: Light touch and protective threshold grossly intact  Musculoskeletal Exam: Moderate hallux vAlgus bilateral.  There is some tenderness with palpation to the first metatarsal head medial eminence bilateral  Radiographic Exam 12/16/2021:  Normal osseous mineralization. Joint spaces preserved. No fracture/dislocation/boney destruction.  Mild to moderate hallux valgus deformity noted bilateral with an increased intermetatarsal angle and medial deviation of the first metatarsal head  Assessment: 1.  Symptomatic hallux valgus bilateral   Plan of Care:  -Patient evaluated -Today the patient was molded for new custom orthotics to support the medial longitudinal arch of the foot and offload pressure from the forefoot -OTC power step insoles were also  dispensed to checkout to wear another shoes -Advise against going barefoot -Return to clinic for orthotics pickup.  PRN with me   *Fifth grade teacher at [employer]   Felecia Shelling, DPM [facility]  Dr. Felecia Shelling, DPM    [address] [place], [place] [address]                Office [phone]  Fax [phone]

## 2023-07-17 ENCOUNTER — Other Ambulatory Visit: Payer: Self-pay

## 2023-07-17 DIAGNOSIS — Z1231 Encounter for screening mammogram for malignant neoplasm of breast: Secondary | ICD-10-CM

## 2023-08-07 ENCOUNTER — Ambulatory Visit
Admission: RE | Admit: 2023-08-07 | Discharge: 2023-08-07 | Disposition: A | Discharge status: Home / Self Care | Payer: Self-pay | Source: Ambulatory Visit

## 2023-08-07 DIAGNOSIS — Z1231 Encounter for screening mammogram for malignant neoplasm of breast: Secondary | ICD-10-CM | POA: Diagnosis present

## 2023-08-30 ENCOUNTER — Telehealth: Payer: Self-pay

## 2023-08-30 NOTE — Telephone Encounter (Signed)
 Patient husband called following up on the patients orthotics if they are ready

## 2023-09-12 ENCOUNTER — Telehealth: Payer: Self-pay | Admitting: Podiatry

## 2023-09-12 NOTE — Telephone Encounter (Signed)
 Checking to see if her Orthotics are back

## 2023-09-21 ENCOUNTER — Ambulatory Visit

## 2023-09-21 NOTE — Progress Notes (Signed)
 Patient presents today to pick up custom molded foot orthotics, diagnosed with capsulitis by Dr. Luster Salters.   Orthotics were dispensed and fit was satisfactory. Reviewed instructions for break-in and wear. Written instructions given to patient.  Patient will follow up as needed.   Britton Cane Cped, CFo, CFm

## 2024-05-24 ENCOUNTER — Other Ambulatory Visit: Payer: Self-pay | Admitting: Obstetrics and Gynecology

## 2024-05-24 DIAGNOSIS — N644 Mastodynia: Secondary | ICD-10-CM

## 2024-06-06 ENCOUNTER — Other Ambulatory Visit

## 2024-06-06 ENCOUNTER — Encounter
